# Patient Record
Sex: Female | Born: 1950 | Race: Black or African American | Hispanic: No | Marital: Single | Smoking: Never smoker
Health system: Southern US, Community
[De-identification: ages and names within clinical notes are randomized; demographics above are authoritative.]

## PROBLEM LIST (undated history)

## (undated) DIAGNOSIS — G4730 Sleep apnea, unspecified: Secondary | ICD-10-CM

## (undated) DIAGNOSIS — I253 Aneurysm of heart: Secondary | ICD-10-CM

## (undated) DIAGNOSIS — G5600 Carpal tunnel syndrome, unspecified upper limb: Secondary | ICD-10-CM

## (undated) DIAGNOSIS — L900 Lichen sclerosus et atrophicus: Secondary | ICD-10-CM

## (undated) DIAGNOSIS — I10 Essential (primary) hypertension: Secondary | ICD-10-CM

## (undated) HISTORY — PX: SALPINGOOPHORECTOMY: SHX82

## (undated) HISTORY — DX: Essential (primary) hypertension: I10

## (undated) HISTORY — DX: Aneurysm of heart: I25.3

## (undated) HISTORY — DX: Sleep apnea, unspecified: G47.30

## (undated) HISTORY — DX: Lichen sclerosus et atrophicus: L90.0

## (undated) HISTORY — DX: Carpal tunnel syndrome, unspecified upper limb: G56.00

## (undated) HISTORY — PX: SHOULDER SURGERY: SHX246

---

## 1998-02-06 ENCOUNTER — Other Ambulatory Visit: Admission: RE | Admit: 1998-02-06 | Discharge: 1998-02-06 | Payer: Self-pay | Admitting: Internal Medicine

## 1998-03-04 ENCOUNTER — Encounter: Admission: RE | Admit: 1998-03-04 | Discharge: 1998-06-02 | Payer: Self-pay | Admitting: Internal Medicine

## 1998-06-12 ENCOUNTER — Encounter: Admission: RE | Admit: 1998-06-12 | Discharge: 1998-09-10 | Payer: Self-pay | Admitting: Internal Medicine

## 1998-07-23 ENCOUNTER — Other Ambulatory Visit: Admission: RE | Admit: 1998-07-23 | Discharge: 1998-07-23 | Payer: Self-pay | Admitting: *Deleted

## 1998-09-30 ENCOUNTER — Encounter: Admission: RE | Admit: 1998-09-30 | Discharge: 1998-12-29 | Payer: Self-pay | Admitting: Internal Medicine

## 1998-10-21 ENCOUNTER — Ambulatory Visit (HOSPITAL_COMMUNITY): Admission: RE | Admit: 1998-10-21 | Discharge: 1998-10-21 | Payer: Self-pay | Admitting: Internal Medicine

## 1998-10-21 ENCOUNTER — Encounter: Payer: Self-pay | Admitting: Internal Medicine

## 2002-06-25 ENCOUNTER — Encounter: Admission: RE | Admit: 2002-06-25 | Discharge: 2002-07-03 | Payer: Self-pay

## 2002-07-18 ENCOUNTER — Encounter: Admission: RE | Admit: 2002-07-18 | Discharge: 2002-10-16 | Payer: Self-pay

## 2002-10-23 ENCOUNTER — Encounter: Admission: RE | Admit: 2002-10-23 | Discharge: 2003-01-21 | Payer: Self-pay

## 2007-02-06 ENCOUNTER — Encounter: Payer: Self-pay | Admitting: Orthopedic Surgery

## 2011-11-03 ENCOUNTER — Ambulatory Visit: Payer: Self-pay | Admitting: Internal Medicine

## 2012-03-08 ENCOUNTER — Ambulatory Visit: Payer: Self-pay | Admitting: Internal Medicine

## 2012-03-15 ENCOUNTER — Ambulatory Visit: Payer: Self-pay | Admitting: Internal Medicine

## 2012-03-15 ENCOUNTER — Emergency Department: Payer: Self-pay | Admitting: Emergency Medicine

## 2012-06-13 IMAGING — CR DG KNEE COMPLETE 4+V*R*
1 series · 4 of 4 positions shown · non-contrast
Comparison: none

REASON FOR EXAM: rt knee pain
COMMENTS:

PROCEDURE:     DXR - DXR KNEE RT COMP WITH OBLIQUES  - November 03, 2011 [DATE]
RESULT:
Images of the images of the right knee show some mild degenerative
hypertrophic marginal spurring. There is no fracture, dislocation or
subluxation.

[Series 1: t knee ap right · 0.14mm/px · 4 of 4 slices shown]
[im 1/4]
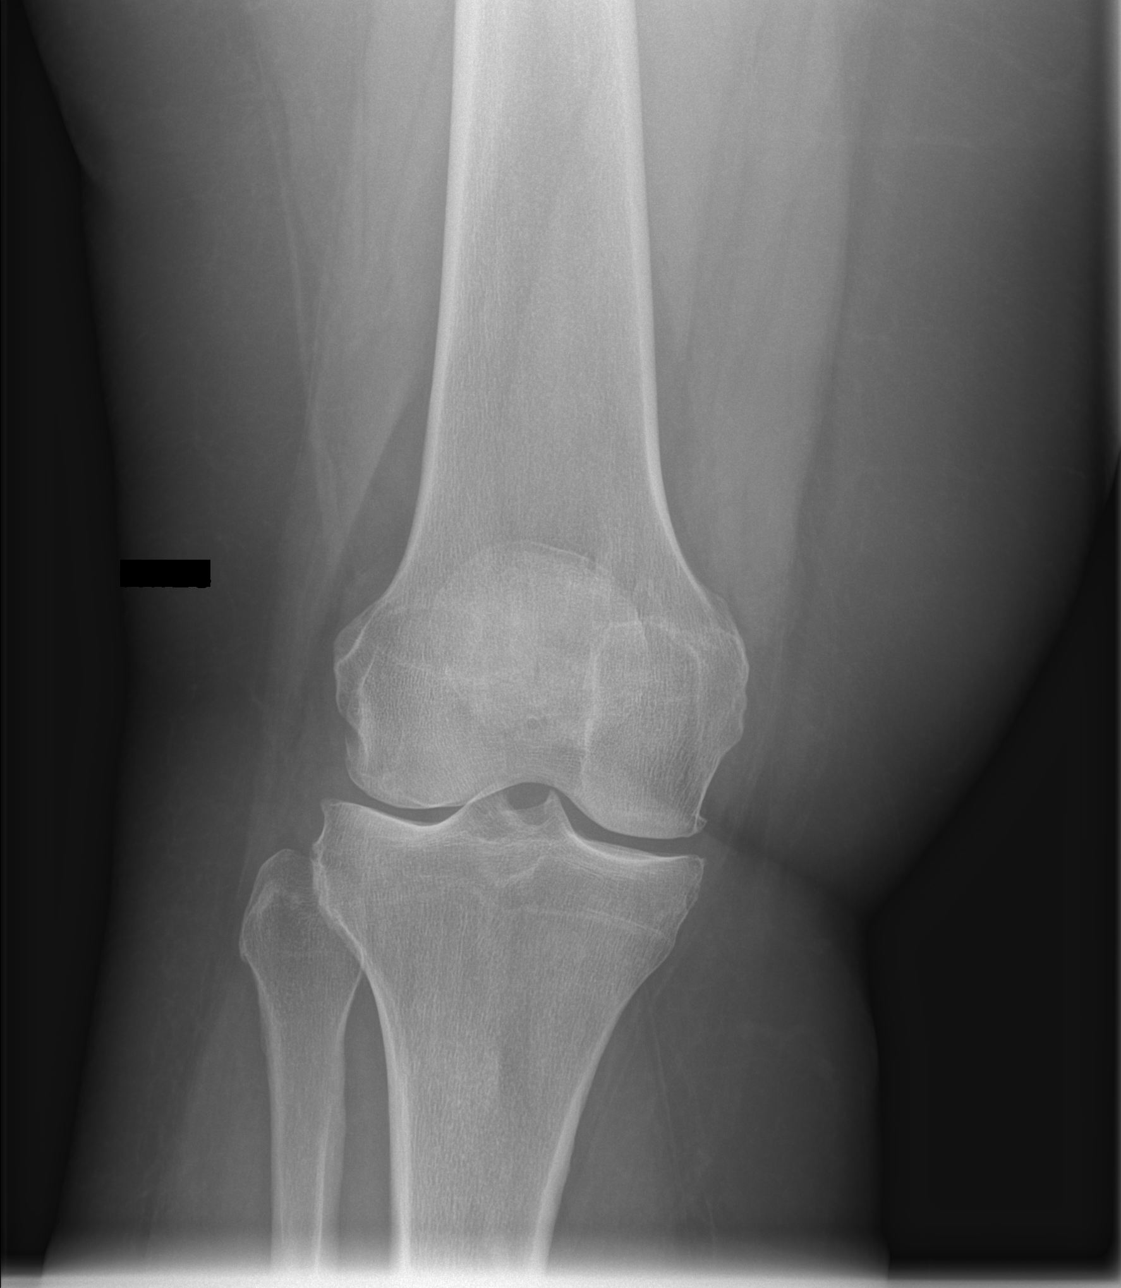
[im 2/4]
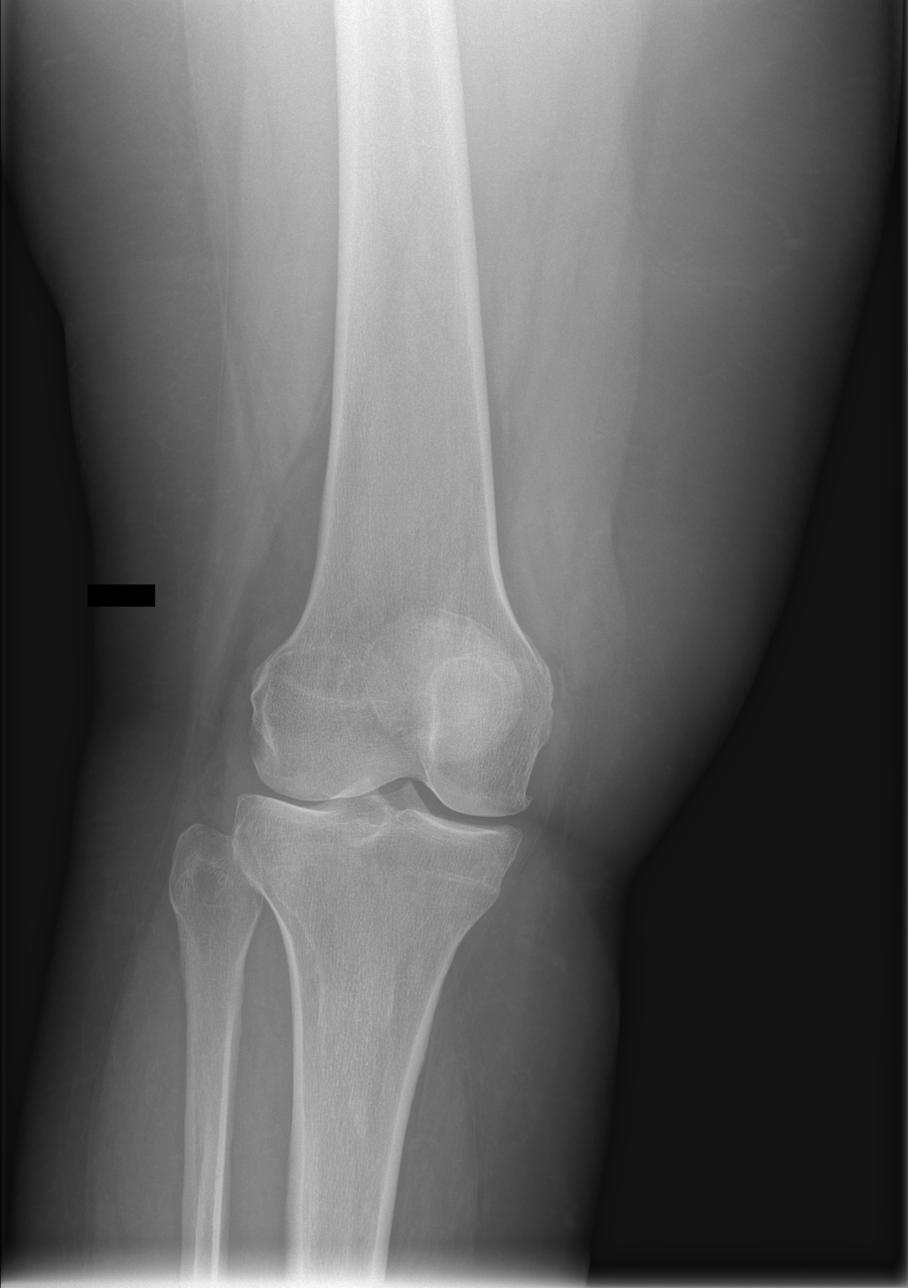
[im 3/4]
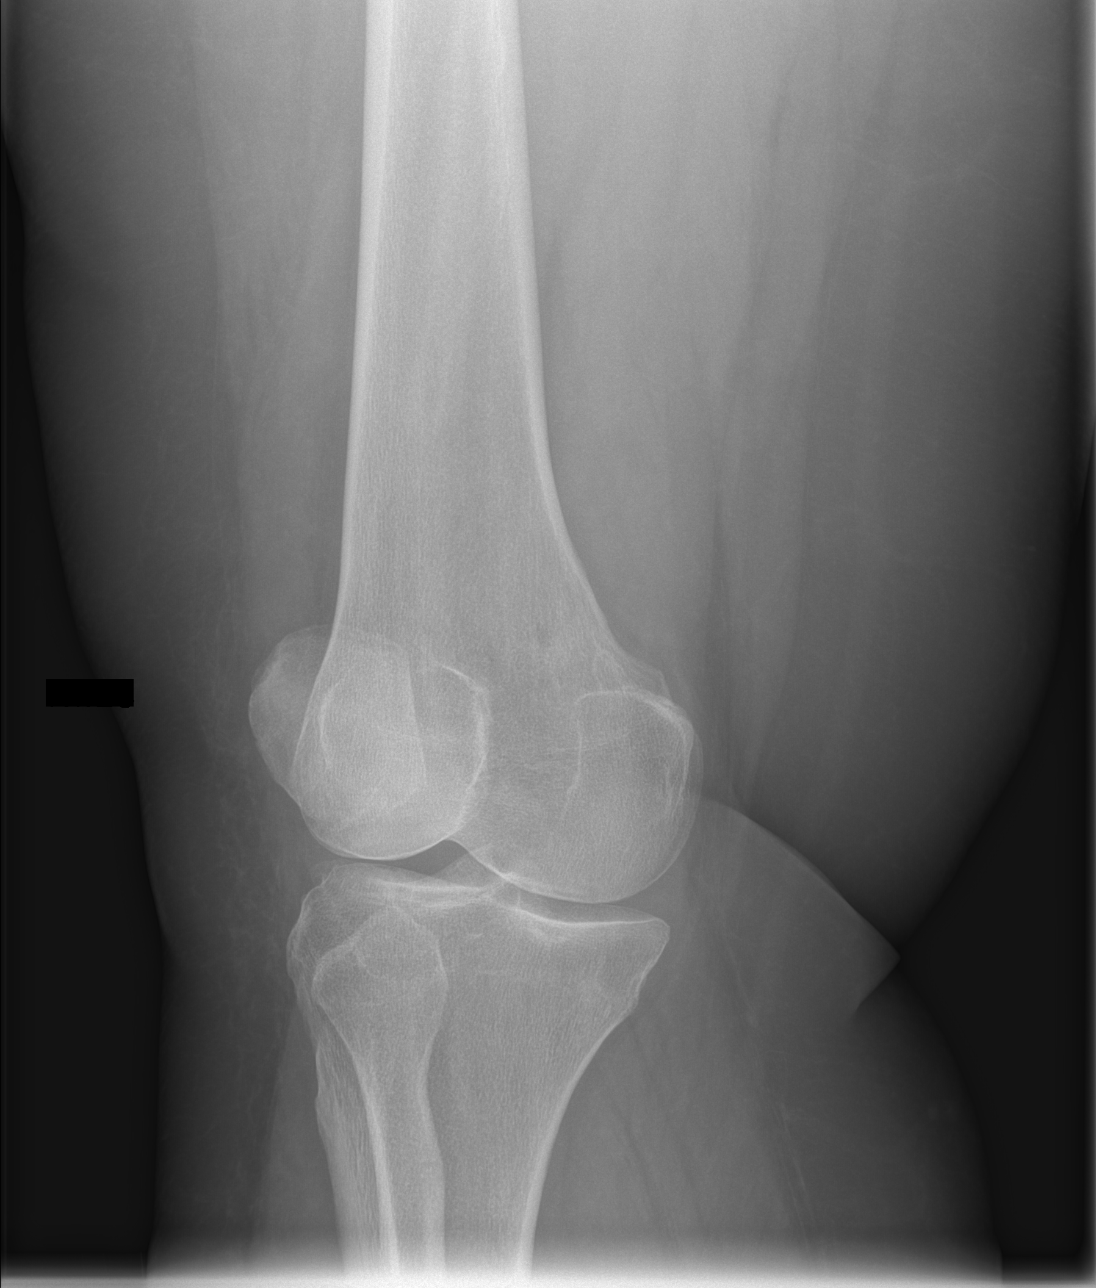
[im 4/4]
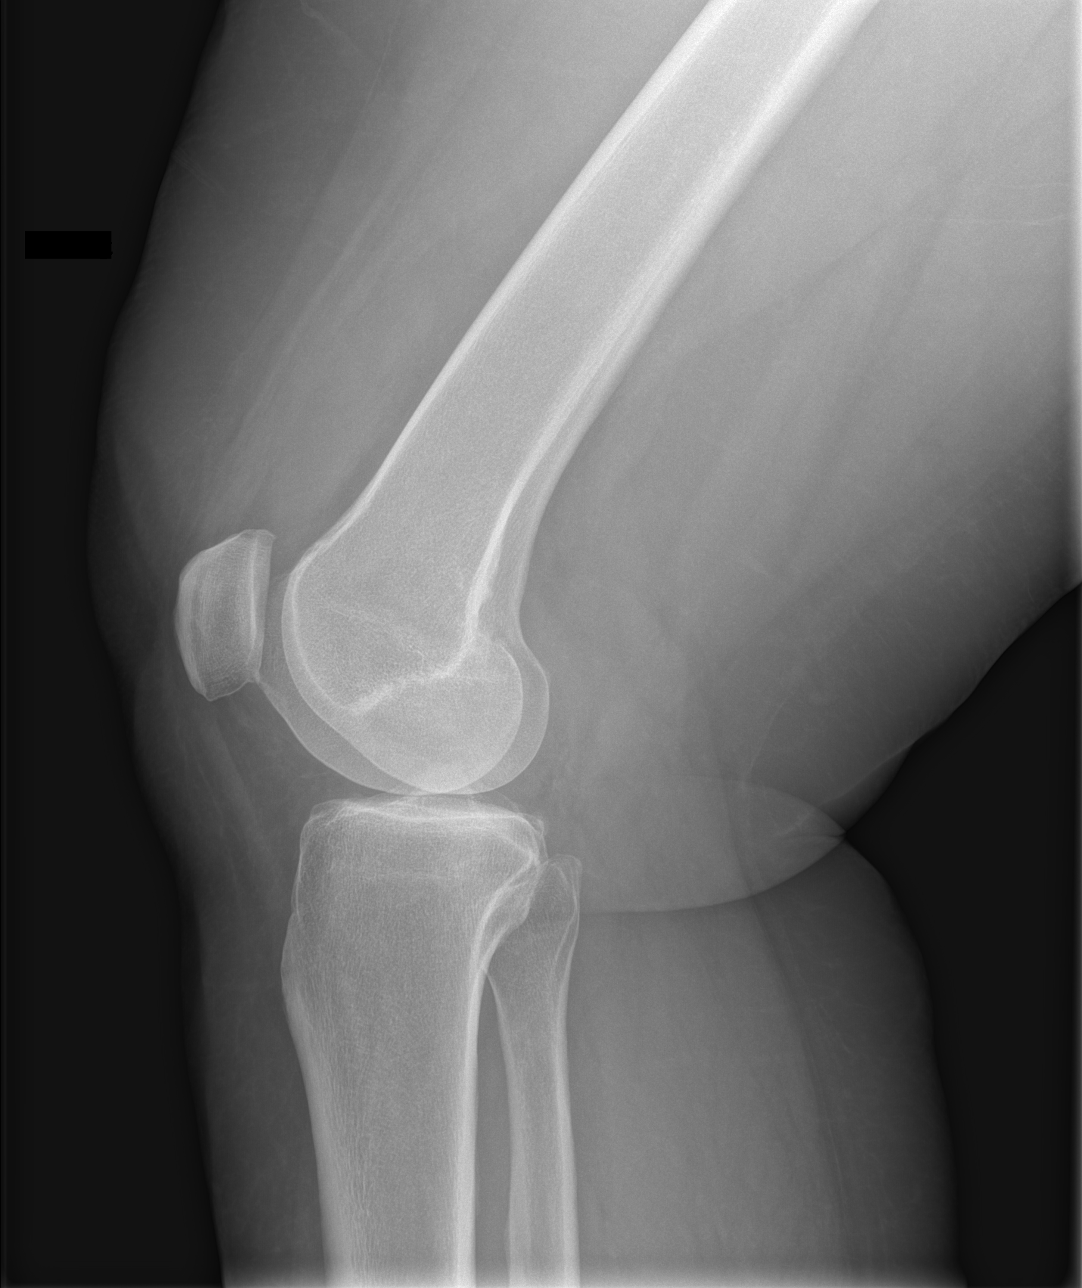

[4 of 4 positions shown; findings below may reference images not displayed]

IMPRESSION: No acute bony abnormality evident in the right knee.

## 2012-07-20 ENCOUNTER — Ambulatory Visit: Payer: Self-pay | Admitting: Obstetrics & Gynecology

## 2012-07-20 LAB — HEMOGLOBIN: HGB: 14.3 g/dL (ref 12.0–16.0)

## 2012-07-27 ENCOUNTER — Ambulatory Visit: Payer: Self-pay | Admitting: Obstetrics & Gynecology

## 2012-07-31 LAB — PATHOLOGY REPORT

## 2012-08-14 ENCOUNTER — Ambulatory Visit: Payer: Self-pay | Admitting: Internal Medicine

## 2012-12-12 ENCOUNTER — Ambulatory Visit: Payer: Self-pay | Admitting: Internal Medicine

## 2012-12-20 ENCOUNTER — Ambulatory Visit: Payer: Self-pay | Admitting: Internal Medicine

## 2013-02-19 ENCOUNTER — Ambulatory Visit: Payer: Self-pay | Admitting: Internal Medicine

## 2013-03-25 IMAGING — CR RIGHT HAND - COMPLETE 3+ VIEW
1 series · 3 of 3 positions shown · non-contrast
Comparison: none

REASON FOR EXAM: pain
COMMENTS:

[Series 1: pa · 0.17mm/px · 3 of 3 slices shown]
[im 1/3]
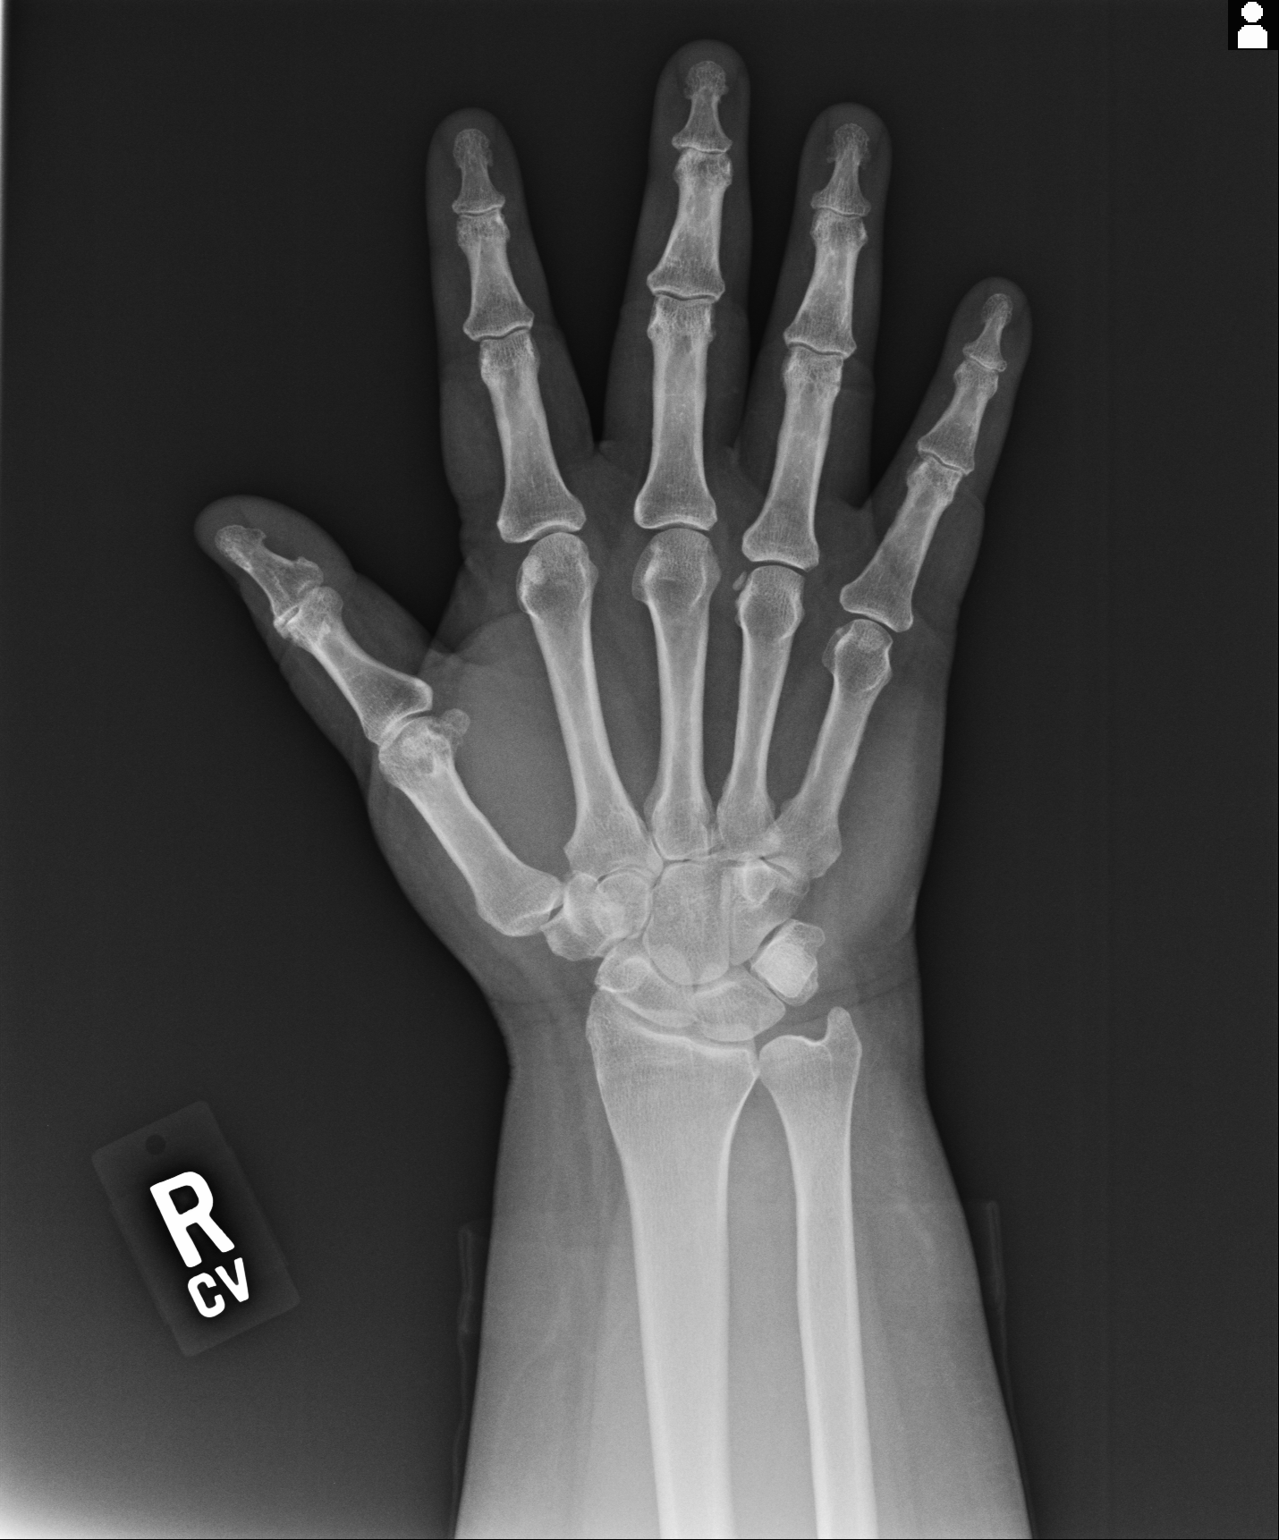
[im 2/3]
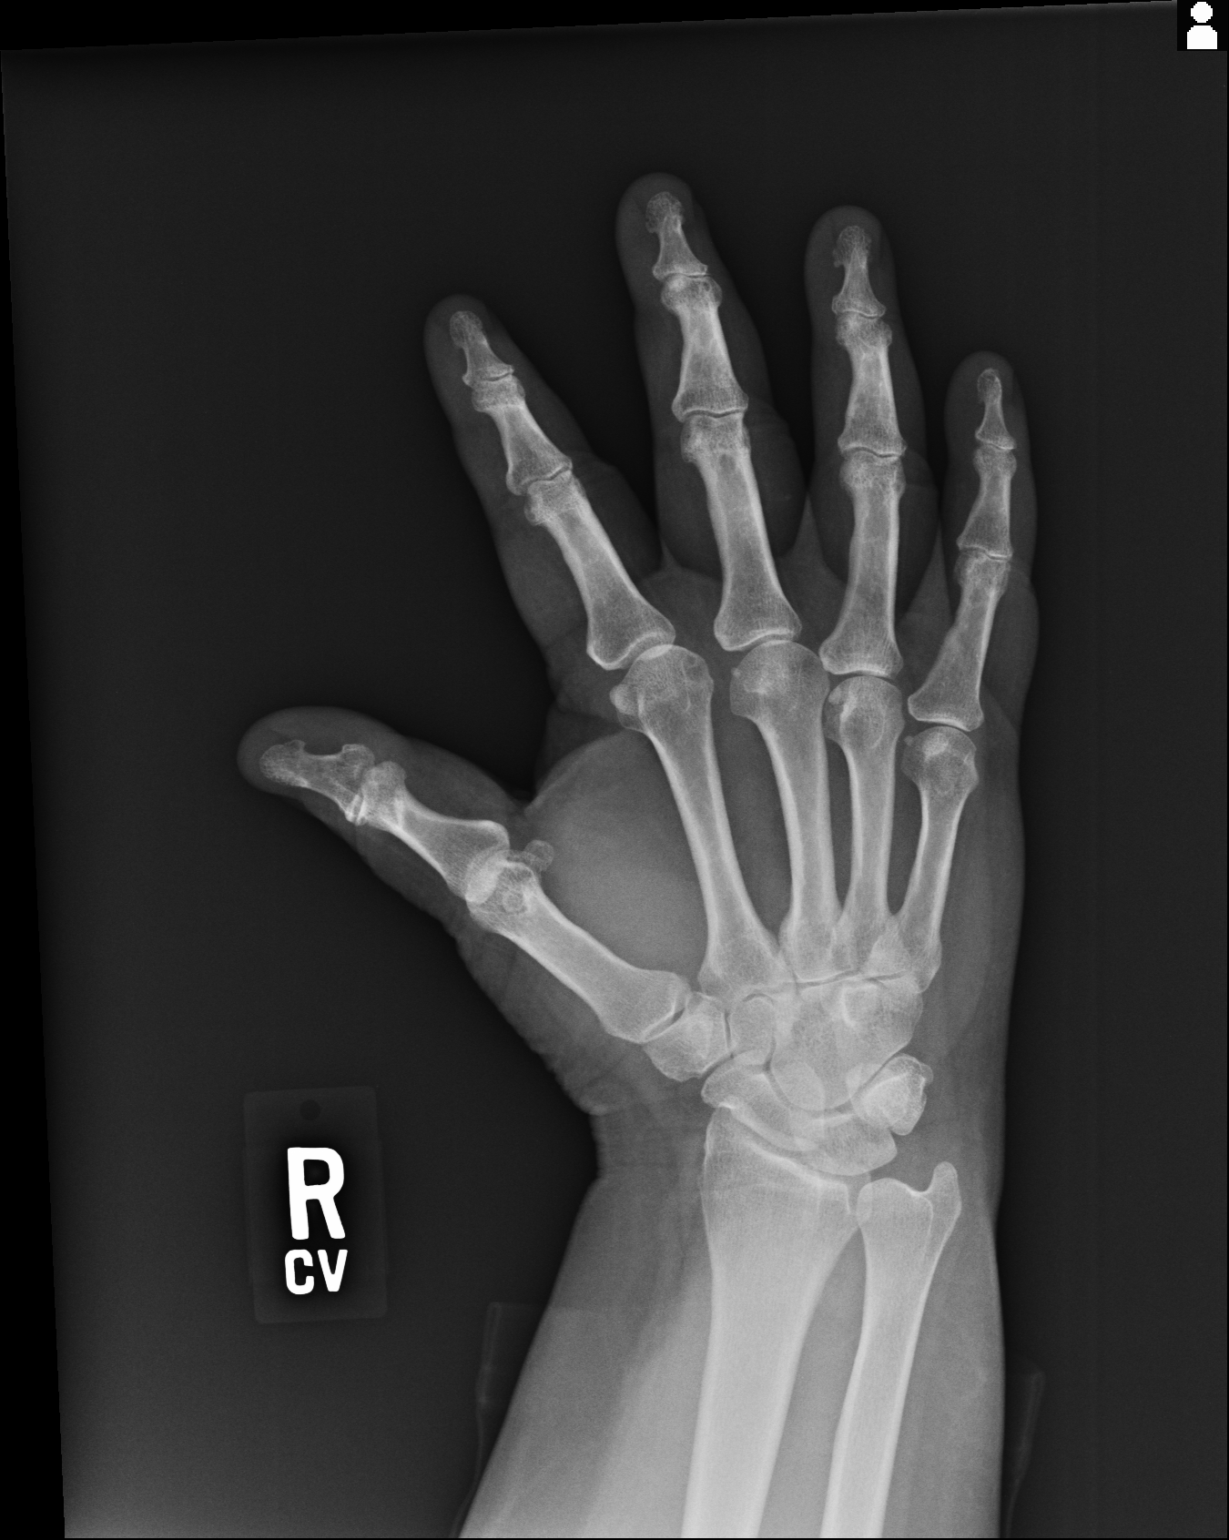
[im 3/3]
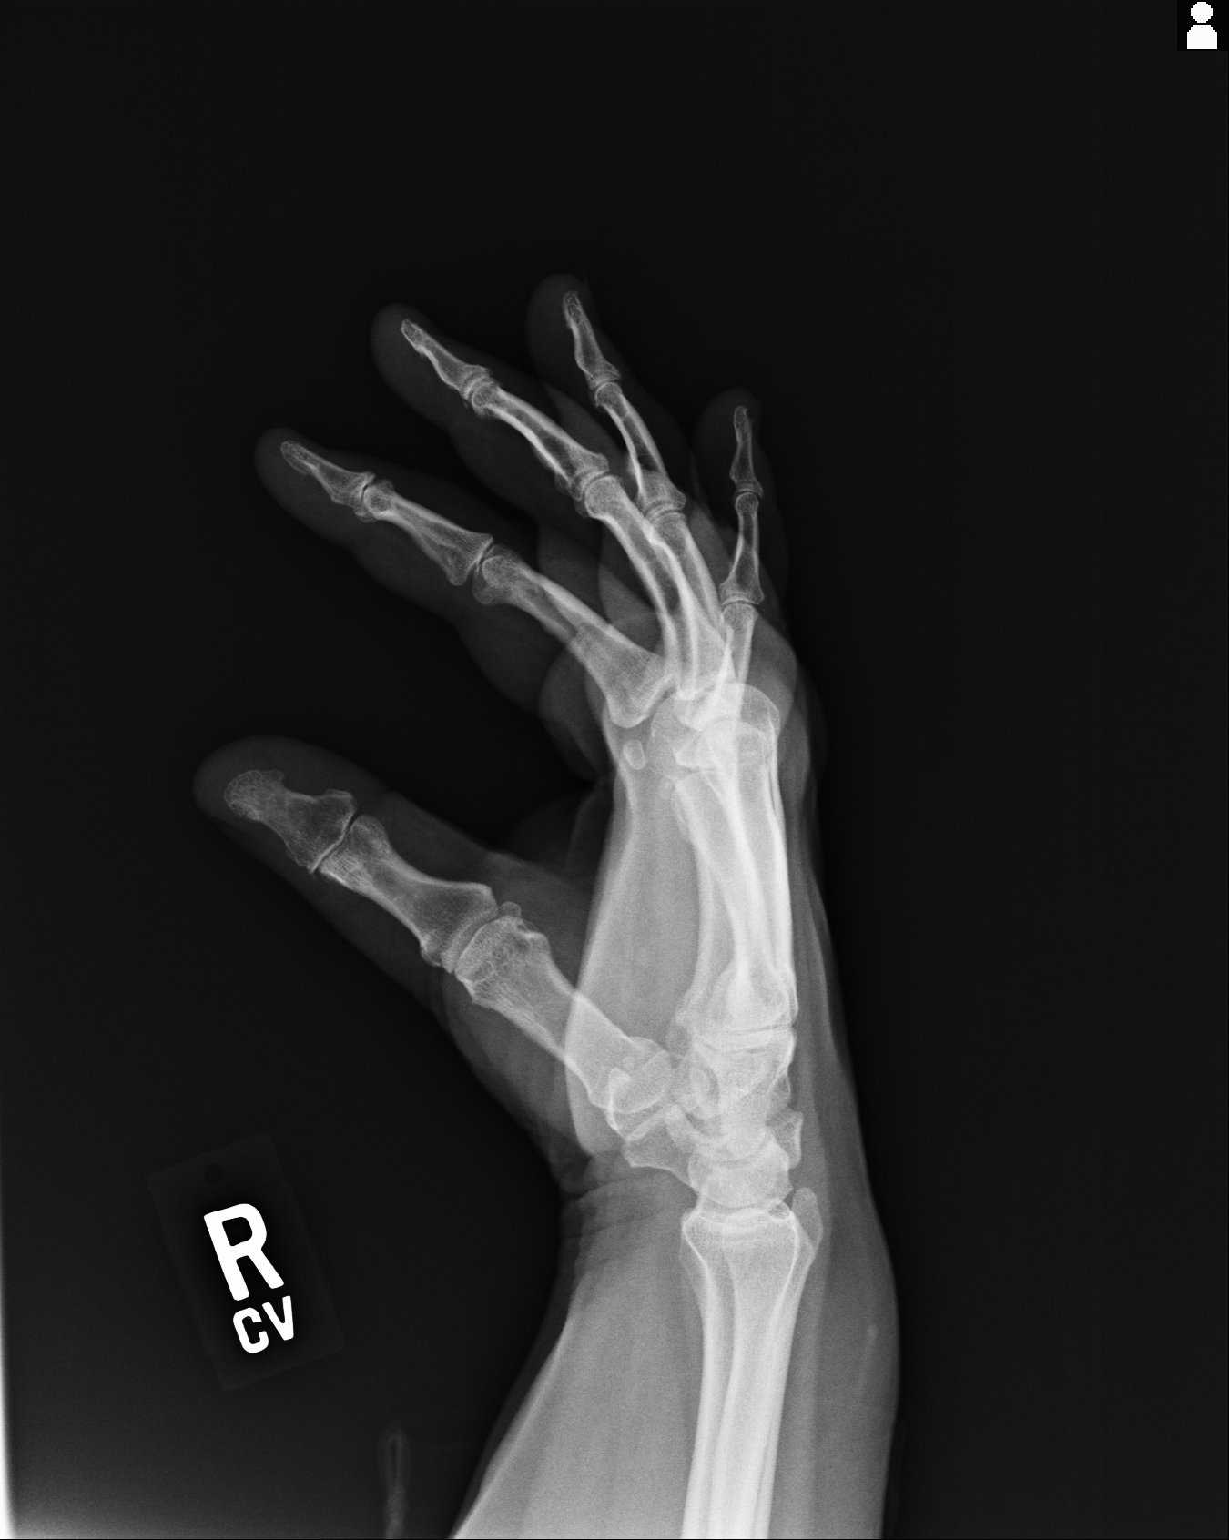

[3 of 3 positions shown; findings below may reference images not displayed]

PROCEDURE:     KDR - KDXR HAND RT COMPLETE W/OBLIQUES  - August 14, 2012  [DATE]

RESULT:     Three views of the right hand are submitted. The bones are
adequately mineralized. There is moderate degenerative change of the
interphalangeal joints with milder degenerative change of the
metacarpophalangeal joints. There is mild narrowing of the carpometacarpal
joints and of the radiocarpal joint. The findings are consistent with
osteoarthritis.
IMPRESSION: There is no acute bony abnormality of the right hand.
Osteoarthritic changes are present.

[REDACTED]

## 2013-11-29 ENCOUNTER — Ambulatory Visit: Payer: Self-pay | Admitting: Adult Health

## 2013-12-18 ENCOUNTER — Ambulatory Visit: Payer: Self-pay | Admitting: Adult Health

## 2014-01-08 ENCOUNTER — Ambulatory Visit: Payer: Self-pay | Admitting: Adult Health

## 2014-01-10 ENCOUNTER — Ambulatory Visit: Payer: Self-pay | Admitting: Adult Health

## 2015-01-07 NOTE — Op Note (Signed)
PATIENT NAME:  Brittany Chen, Brittany Chen MR#:  [id number] DATE OF BIRTH:  [birth year]  DATE OF PROCEDURE:  07/27/2012  PREOPERATIVE DIAGNOSES:  1. Postmenopausal bleeding. 2. Endometrial polyps.   POSTOPERATIVE DIAGNOSES:  1. Postmenopausal bleeding. 2. Endometrial polyps.   PROCEDURES:  1. Hysteroscopy. 2. Dilation and curettage.   SURGEON: Verlin GrillsEryn Stansbury Clipp, MD   ANESTHESIA: General.  COMPLICATIONS: None.   ESTIMATED BLOOD LOSS: Minimal.  URINE OUTPUT: 25 mL.  IV FLUIDS: 500 mL Crystalloid.   FINDINGS: Small anteverted uterus, normal cervix, normal tubal ostia bilaterally, two small 1 cm endometrial polyps, one in the fundal position and the other on the left posterior wall, both intracavitary.   MEDICATIONS: None.   PROCEDURE IN DETAIL: The patient was taken to the operating room, given anesthesia via general. Her legs were placed in candy-cane stirrups. She was prepped and draped in standard sterile fashion. Bladder was drained. Speculum was inserted into the vagina. Cervix was visualized and grasped anteriorly with a single-tooth tenaculum. Cervix was then serially dilated up to 25 [place]. Hysteroscope was then inserted with findings noted above. Hysteroscope was then removed and curettage was performed with endometrial curettings removed for pathology. Hysteroscope was then reinserted and normal uterine cavity was visualized free of any further endometrial polyps. All instruments were then removed from the vagina. Hemostasis was excellent. The patient tolerated the procedure well. Counts were correct x2. She was taken to the PAC-U in stable condition.   ____________________________ Ali LoweEryn K. Garnette GunnerStansbury Clipp, MD eks:drc D: 07/27/2012 15:30:20 ET T: 07/27/2012 15:58:06 ET JOB#: [id number]  cc: Weston SettleEryn K. Garnette GunnerStansbury Clipp, MD, <Dictator> Weston SettleERYN Lona KettleK [place] CLIPP MD ELECTRONICALLY SIGNED 08/02/2012 15:52

## 2018-11-02 ENCOUNTER — Other Ambulatory Visit (HOSPITAL_COMMUNITY): Payer: Self-pay | Admitting: Gastroenterology

## 2018-11-02 DIAGNOSIS — R1013 Epigastric pain: Secondary | ICD-10-CM

## 2018-11-02 DIAGNOSIS — R1011 Right upper quadrant pain: Secondary | ICD-10-CM

## 2018-11-02 DIAGNOSIS — R112 Nausea with vomiting, unspecified: Secondary | ICD-10-CM

## 2018-11-07 ENCOUNTER — Other Ambulatory Visit: Payer: Self-pay | Admitting: Gastroenterology

## 2018-11-07 DIAGNOSIS — R1011 Right upper quadrant pain: Secondary | ICD-10-CM

## 2018-11-07 DIAGNOSIS — R1013 Epigastric pain: Secondary | ICD-10-CM

## 2018-11-07 DIAGNOSIS — R112 Nausea with vomiting, unspecified: Secondary | ICD-10-CM

## 2018-11-14 ENCOUNTER — Ambulatory Visit
Admission: RE | Admit: 2018-11-14 | Discharge: 2018-11-14 | Disposition: A | Discharge status: Home / Self Care | Payer: Medicare Other | Source: Ambulatory Visit | Attending: Gastroenterology | Admitting: Gastroenterology

## 2018-11-14 DIAGNOSIS — R1013 Epigastric pain: Secondary | ICD-10-CM

## 2018-11-14 DIAGNOSIS — R112 Nausea with vomiting, unspecified: Secondary | ICD-10-CM

## 2018-11-14 DIAGNOSIS — R1011 Right upper quadrant pain: Secondary | ICD-10-CM

## 2018-11-15 ENCOUNTER — Other Ambulatory Visit: Payer: Self-pay

## 2018-11-16 ENCOUNTER — Ambulatory Visit: Payer: Medicare Other

## 2018-11-17 ENCOUNTER — Ambulatory Visit
Admission: RE | Admit: 2018-11-17 | Discharge: 2018-11-17 | Disposition: A | Discharge status: Home / Self Care | Payer: Medicare Other | Source: Ambulatory Visit | Attending: Gastroenterology | Admitting: Gastroenterology

## 2018-11-17 DIAGNOSIS — R1011 Right upper quadrant pain: Secondary | ICD-10-CM | POA: Insufficient documentation

## 2018-11-17 DIAGNOSIS — R1013 Epigastric pain: Secondary | ICD-10-CM | POA: Insufficient documentation

## 2018-11-17 DIAGNOSIS — R112 Nausea with vomiting, unspecified: Secondary | ICD-10-CM | POA: Diagnosis present

## 2019-06-28 IMAGING — US US ABDOMEN LIMITED
1 series · 14 of 25 positions shown · non-contrast
Comparison: CT, 11/26/2016

CLINICAL DATA: Abdominal pain with nausea since mid Gevo.

EXAM:
ULTRASOUND ABDOMEN LIMITED RIGHT UPPER QUADRANT

[Series 1: us abdomen limited · 14 of 44 slices shown]
[im 1/44]
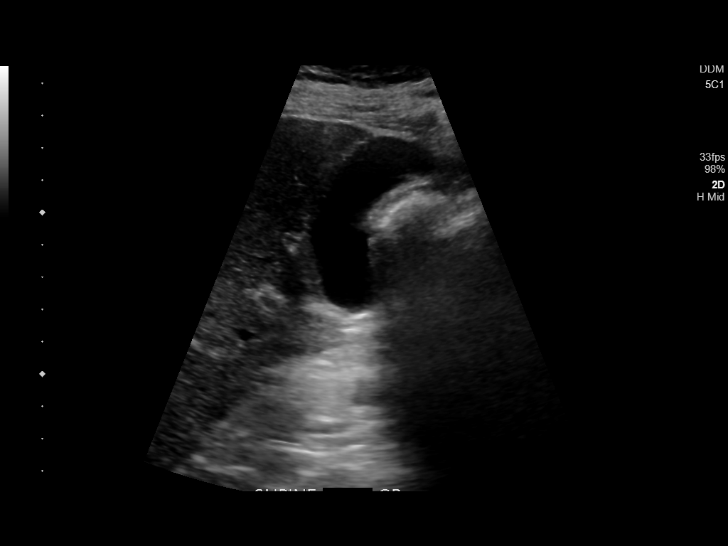
[im 4/44]
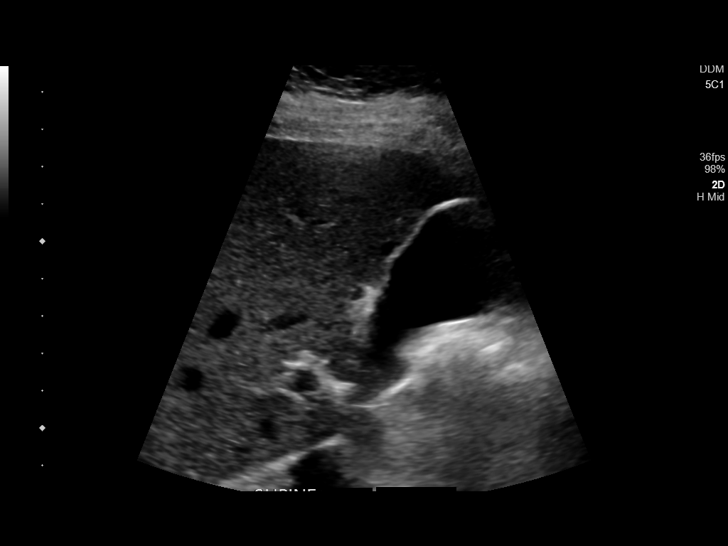
[im 8/44]
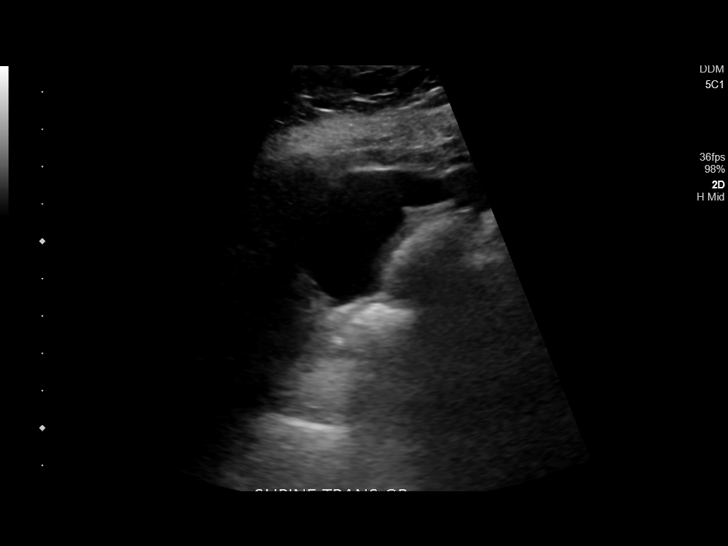
[im 11/44]
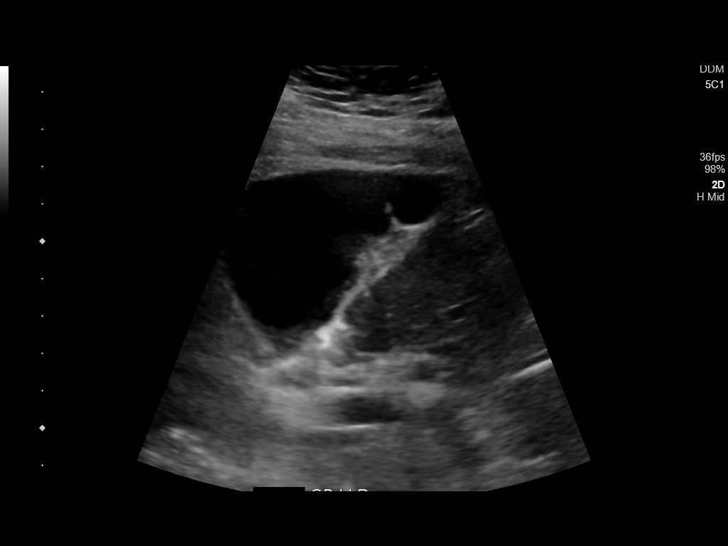
[im 15/44]
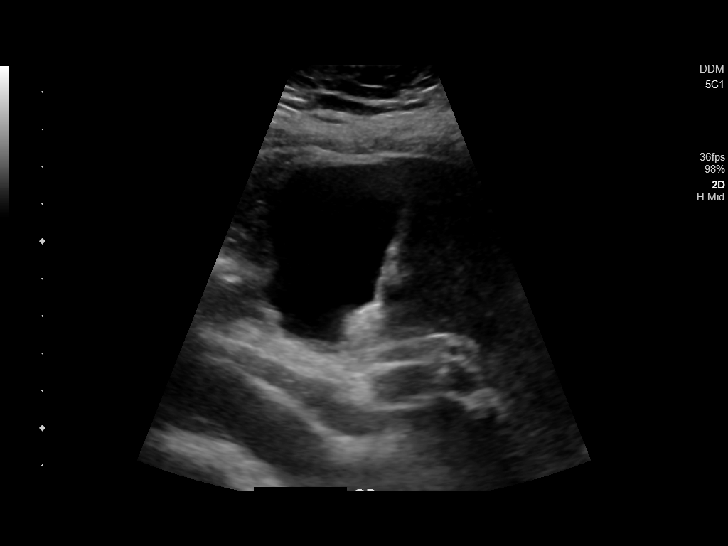
[im 17/44]
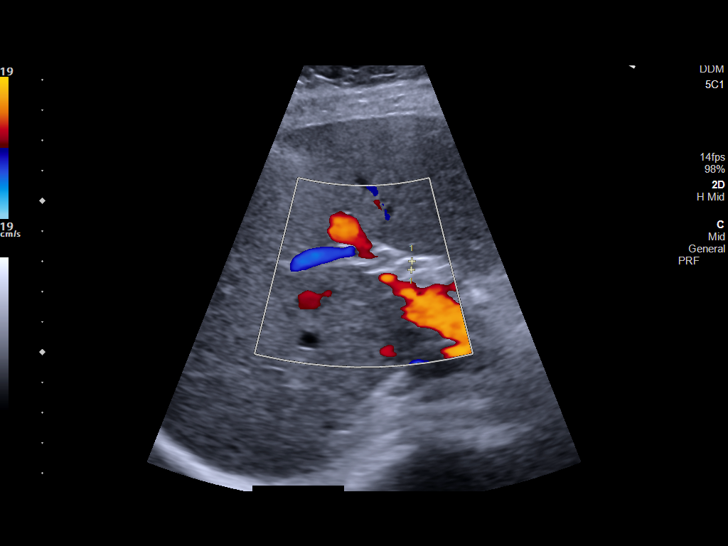
[im 20/44]
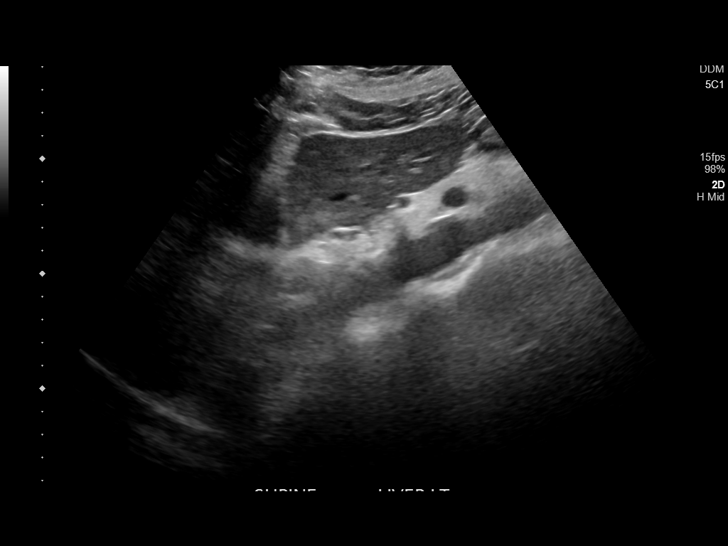
[im 24/44]
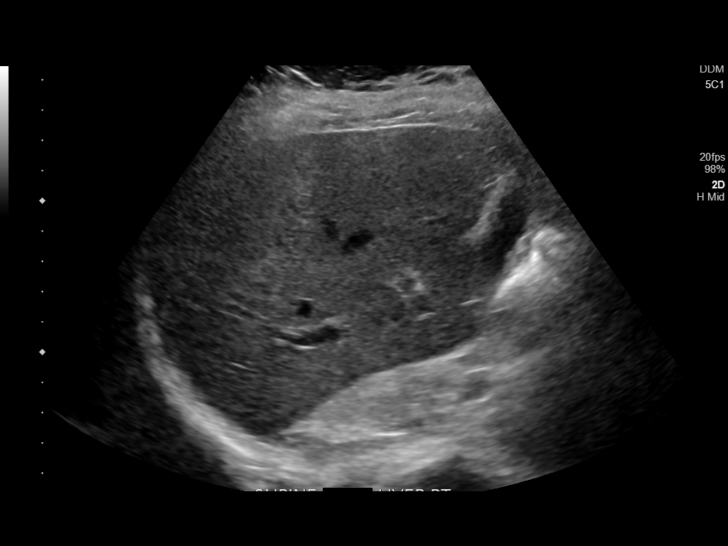
[im 27/44]
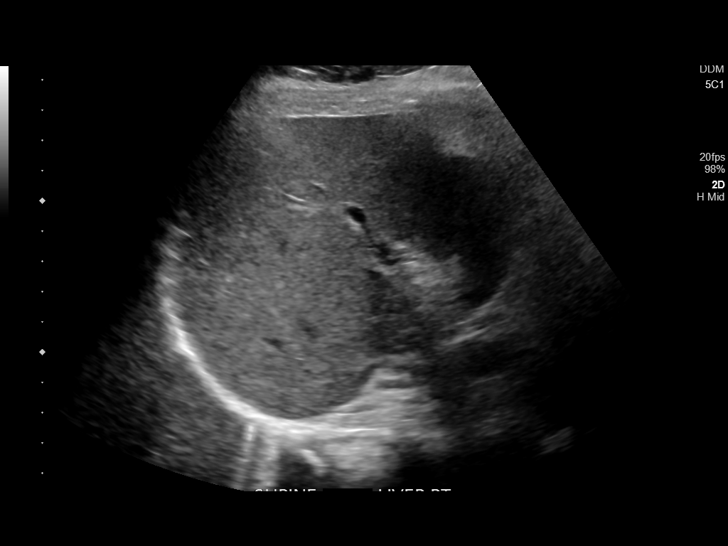
[im 29/44]
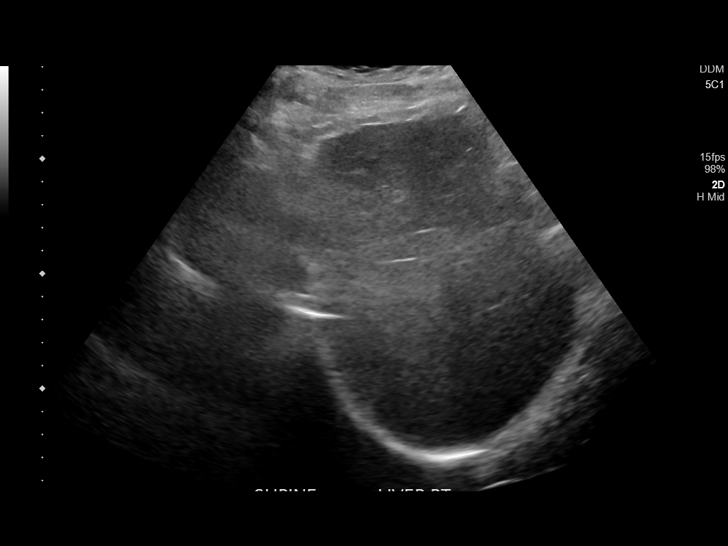
[im 33/44]
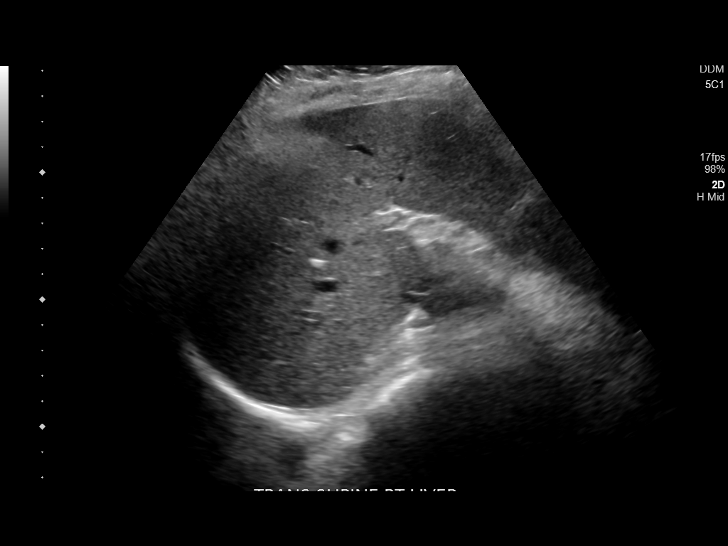
[im 36/44]
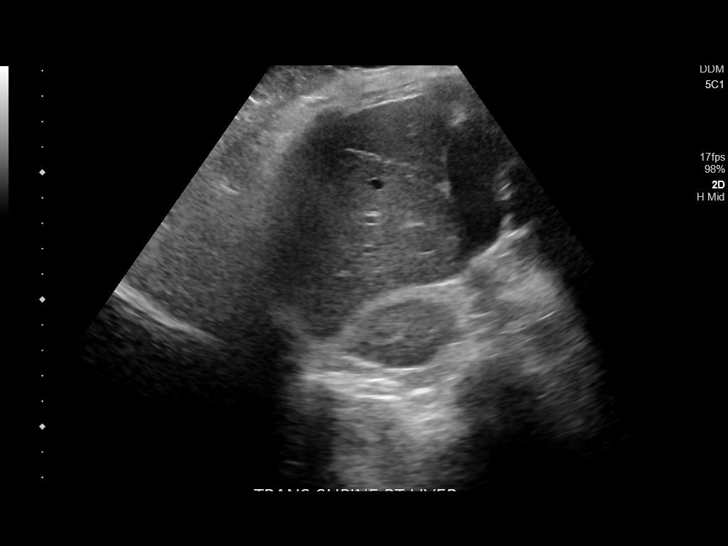
[im 40/44]
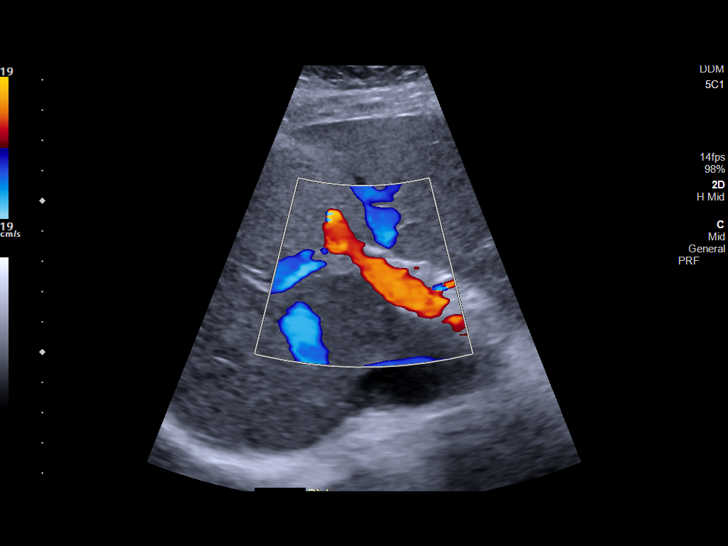
[im 44/44]
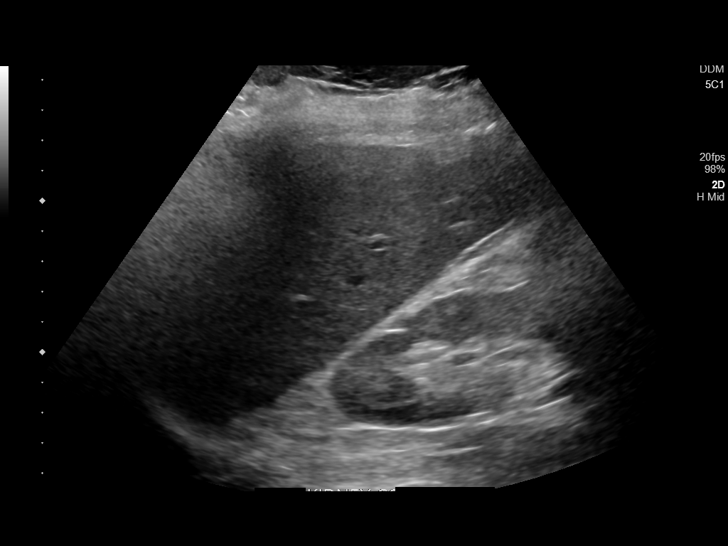

[14 of 25 positions shown; findings below may reference images not displayed]

FINDINGS: Gallbladder:

No gallstones or wall thickening visualized. No sonographic Murphy
sign noted by sonographer.

Common bile duct:

Diameter: 3 mm

Liver:

No focal lesion identified. Within normal limits in parenchymal
echogenicity. Portal vein is patent on color Doppler imaging with
normal direction of blood flow towards the liver.
IMPRESSION: Normal right upper quadrant ultrasound.

## 2022-05-17 ENCOUNTER — Other Ambulatory Visit (HOSPITAL_COMMUNITY)
Admission: RE | Admit: 2022-05-17 | Discharge: 2022-05-17 | Disposition: A | Discharge status: Home / Self Care | Payer: Medicare Other | Source: Ambulatory Visit | Attending: Obstetrics & Gynecology | Admitting: Obstetrics & Gynecology

## 2022-05-17 ENCOUNTER — Encounter: Payer: Self-pay | Admitting: Obstetrics & Gynecology

## 2022-05-17 ENCOUNTER — Ambulatory Visit (INDEPENDENT_AMBULATORY_CARE_PROVIDER_SITE_OTHER): Payer: Medicare Other | Admitting: Obstetrics & Gynecology

## 2022-05-17 VITALS — BP 140/86 | Ht 67.0 in | Wt 212.6 lb

## 2022-05-17 DIAGNOSIS — N930 Postcoital and contact bleeding: Secondary | ICD-10-CM | POA: Diagnosis present

## 2022-05-17 DIAGNOSIS — N952 Postmenopausal atrophic vaginitis: Secondary | ICD-10-CM

## 2022-05-17 DIAGNOSIS — Z1231 Encounter for screening mammogram for malignant neoplasm of breast: Secondary | ICD-10-CM | POA: Diagnosis not present

## 2022-05-17 DIAGNOSIS — N950 Postmenopausal bleeding: Secondary | ICD-10-CM | POA: Insufficient documentation

## 2022-05-17 MED ORDER — ESTRADIOL 10 MCG VA TABS: ORAL_TABLET | VAGINAL | 12 refills | Status: AC

## 2022-05-17 NOTE — Progress Notes (Signed)
   Subjective:    Patient ID: Brittany Chen, female    DOB: 24-Sep-1950, 71 y.o.   MRN: [id number]  HPI 71 yo single P2 (LC1) here with 2 occasions of noting vaginal bleeding after digital masturbation. She is not sexually active with anyone else at this time. However, she has met a fellow and plans to have sex with him. But she is worried about the atrophy and lichen with regard to having sex. She denies a family history of gyn cancers but her sister was diagnosed with breast cancer around age 3 and she has a paternal aunt with breast cancer.   Review of Systems She has lichen sclerosis and uses halobetasol daily.     Objective:   Physical Exam Well nourished, well hydrated White female, no apparent distress She is ambulating and conversing normally. 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  normal size and shape, anteverted, mobile, non-tender, normal adnexal exam    Consent signed, time out done Cervix prepped with betadine and sprayed with Hurricaine spray. I then grasped with a single tooth tenaculum. Uterus sounded to 8 cm Pipelle used for 2 passes with a scant amount of tissue obtained. She tolerated the procedure well.       Assessment & Plan:   PMB/Post "coital" bleeding- check gyn ultrasound and await pathology from [facility] Symptomatic vulvar atrophy- generic vagifem prescribed 2 times per week

## 2022-05-19 LAB — SURGICAL PATHOLOGY

## 2022-05-20 ENCOUNTER — Ambulatory Visit (INDEPENDENT_AMBULATORY_CARE_PROVIDER_SITE_OTHER): Payer: Medicare Other

## 2022-05-20 DIAGNOSIS — N930 Postcoital and contact bleeding: Secondary | ICD-10-CM

## 2022-05-20 DIAGNOSIS — N950 Postmenopausal bleeding: Secondary | ICD-10-CM

## 2022-05-21 ENCOUNTER — Encounter: Payer: Self-pay | Admitting: Obstetrics & Gynecology

## 2022-05-25 ENCOUNTER — Encounter: Payer: Self-pay | Admitting: Obstetrics & Gynecology

## 2022-08-19 ENCOUNTER — Telehealth: Payer: Self-pay

## 2022-08-19 NOTE — Telephone Encounter (Signed)
Pt calling stating she is having "bleeding with masturbation". Please help get appt scheduled.
